# Patient Record
Sex: Male | Born: 1980 | Race: White | Hispanic: No | Marital: Married | Smoking: Former smoker
Health system: Southern US, Community
[De-identification: ages and names within clinical notes are randomized; demographics above are authoritative.]

## PROBLEM LIST (undated history)

## (undated) DIAGNOSIS — I82409 Acute embolism and thrombosis of unspecified deep veins of unspecified lower extremity: Secondary | ICD-10-CM

## (undated) HISTORY — PX: ANTERIOR CRUCIATE LIGAMENT REPAIR: SHX115

---

## 2016-03-07 ENCOUNTER — Emergency Department (HOSPITAL_COMMUNITY)
Admission: EM | Admit: 2016-03-07 | Discharge: 2016-03-08 | Disposition: A | Discharge status: Home / Self Care | Payer: No Typology Code available for payment source | Attending: Emergency Medicine | Admitting: Emergency Medicine

## 2016-03-07 ENCOUNTER — Emergency Department (HOSPITAL_COMMUNITY): Payer: No Typology Code available for payment source

## 2016-03-07 ENCOUNTER — Encounter (HOSPITAL_COMMUNITY): Payer: Self-pay | Admitting: *Deleted

## 2016-03-07 DIAGNOSIS — Y9289 Other specified places as the place of occurrence of the external cause: Secondary | ICD-10-CM | POA: Insufficient documentation

## 2016-03-07 DIAGNOSIS — Y998 Other external cause status: Secondary | ICD-10-CM | POA: Diagnosis not present

## 2016-03-07 DIAGNOSIS — W1849XA Other slipping, tripping and stumbling without falling, initial encounter: Secondary | ICD-10-CM | POA: Insufficient documentation

## 2016-03-07 DIAGNOSIS — S99912A Unspecified injury of left ankle, initial encounter: Secondary | ICD-10-CM | POA: Diagnosis present

## 2016-03-07 DIAGNOSIS — Y9389 Activity, other specified: Secondary | ICD-10-CM | POA: Diagnosis not present

## 2016-03-07 DIAGNOSIS — S86002A Unspecified injury of left Achilles tendon, initial encounter: Secondary | ICD-10-CM | POA: Diagnosis not present

## 2016-03-07 MED ORDER — OXYCODONE-ACETAMINOPHEN 5-325 MG PO TABS
1.0000 | ORAL_TABLET | Freq: Once | ORAL | Status: AC
  Administered 2016-03-07: 1 via ORAL
  Filled 2016-03-07: qty 1

## 2016-03-07 NOTE — ED Notes (Signed)
Pt was playing kickball when he slipped and heard a pop.  Pt worries that he may have injured his achilles tendon, some bruising noted on foot under ankle

## 2016-03-07 NOTE — ED Provider Notes (Signed)
CSN: [id number]     Arrival date & time 03/07/16  2234 History  By signing my name below, I, Bethel Born, attest that this documentation has been prepared under the direction and in the presence of [employer]. Electronically Signed: Bethel Born, ED Scribe. 03/07/2016 11:00 PM  Chief Complaint  Patient presents with  . Ankle Pain    The history is provided by the patient. No language interpreter was used.    Frank Dixon is a 35 y.o. male who presents to the Emergency Department complaining of new, intermittent, 3/10 in severity at rest, posterior left ankle pain with sudden onset this evening while playing kickball. The pt states that he slipped in the wet grass and felt a "pop" at his ankle.  The pain is exacerbated by movement and palpation. He took Advil PTA without sufficient pain relief. He has not attempted to bear weight on the ankle since the fall. Pt denies numbness, tingling, and weakness.   History reviewed. No pertinent past medical history. History reviewed. No pertinent past surgical history. No family history on file. Social History  Substance Use Topics  . Smoking status: Never Smoker   . Smokeless tobacco: None  . Alcohol Use: No     Review of Systems  Musculoskeletal: Positive for myalgias and arthralgias.  Skin: Negative for wound.    Allergies  Review of patient's allergies indicates no known allergies.  Home Medications   Prior to Admission medications   Medication Sig Start Date End Date Taking? Authorizing Provider  oxyCODONE-acetaminophen (PERCOCET/ROXICET) 5-325 MG tablet Take 1 tablet by mouth every 4 (four) hours as needed for severe pain. 03/08/16   Kiyoshi Schaab C Karmela Bram, PA-C    BP 134/81 mmHg  Pulse 67  Temp(Src) 98.6 F (37 C) (Oral)  Resp 20  SpO2 99% Physical Exam  Constitutional: He is oriented to person, place, and time. He appears well-developed and well-nourished. No distress.  HENT:  Head: Normocephalic and  atraumatic.  Right Ear: External ear normal.  Left Ear: External ear normal.  Nose: Nose normal.  Eyes: Conjunctivae and EOM are normal. Right eye exhibits no discharge. Left eye exhibits no discharge. No scleral icterus.  Neck: Normal range of motion. Neck supple.  Cardiovascular: Normal rate and regular rhythm.   Pulmonary/Chest: Effort normal and breath sounds normal. No respiratory distress.  Musculoskeletal: He exhibits tenderness. He exhibits no edema.       Legs: TTP to distal aspect of left posterior calf. Negative thompson test. No erythema or heat. No TTP to left ankle or left foot. Decreased ROM of left ankle due to pain, though patient able to flex and extend his left ankle.  Neurological: He is alert and oriented to person, place, and time.  Skin: Skin is warm and dry. He is not diaphoretic.  Psychiatric: He has a normal mood and affect. His behavior is normal.  Nursing note and vitals reviewed.   ED Course  Procedures (including critical care time)  DIAGNOSTIC STUDIES: Oxygen Saturation is 99% on RA,  normal by my interpretation.    COORDINATION OF CARE: 10:54 PM Discussed treatment plan which includes left ankle XR and pain management with pt at bedside and pt agreed to plan.  Labs Review Labs Reviewed - No data to display  Imaging Review Dg Ankle Complete Left  03/08/2016  CLINICAL DATA:  35 year old male with pain in the left Achilles EXAM: LEFT ANKLE COMPLETE - 3+ VIEW COMPARISON:  None. FINDINGS: There is no fracture or  dislocation. The bones are well mineralized. There is inflammatory changes of the soft tissues of the posterior ankle with the epicenter of the inflammation approximately 6 cm above the superior cortex of the calcaneus concerning for partial injury of the Achilles tendon. Clinical correlation is recommended. Ultrasound or MRI may provide better evaluation of the Achilles tendon. IMPRESSION: No acute osseous pathology. Inflammatory changes of the  superficial soft tissues of the posterior ankle and upper aspect of the Kager's fat pad concerning for Achilles tendon injury. Clinical correlation is recommended. Electronically Signed   By: Elgie CollardArash  Radparvar M.D.   On: 03/08/2016 00:05   I have personally reviewed and evaluated these images as part of my medical decision-making.   EKG Interpretation None      MDM   Final diagnoses:  Injury of left Achilles tendon, initial encounter    35 year old male presents with pain near his left achilles tendon, which he states started today prior to arrival while playing kickball. He notes he heard a popping sound and has had pain since that time. He denies numbness, weakness, paresthesia. Patient is afebrile. Vital signs stable. On exam, patient has TTP to the distal aspect of his left posterior calf. Negative thompson test. No erythema or heat. No TTP to left ankle or left foot. Patient has decreased ROM of his left ankle due to pain, though he is able to flex and extend his left ankle.   Will obtain imaging of left ankle to evaluate for possible avulsion injury. Patient given pain medicine.   Imaging negative for osseous pathology, remarkable for inflammatory changes to the superficial soft tissues and posterior ankle and upper aspect of the kager's fat pad concerning for partial injury of the achilles tendon. Discussed findings with patient. Will place in posterior splint and give crutches. Patient instructed to be non weight-bearing with his left lower extremity. Advised to rest, ice, and elevate. Will give short course of pain medicine. Patient to follow up with orthopedics for further evaluation and management. Strict return precautions discussed. Patient verbalizes his understanding and is in agreement with plan.  BP 129/76 mmHg  Pulse 67  Temp(Src) 98.6 F (37 C) (Oral)  Resp 18  SpO2 97%    Mady Gemmalizabeth C Dillon Livermore, PA-C 03/08/16 [id number]  Pricilla LovelessScott Goldston, MD 03/13/16 [phone]

## 2016-03-08 MED ORDER — OXYCODONE-ACETAMINOPHEN 5-325 MG PO TABS: 1.0000 | ORAL_TABLET | ORAL | Status: AC | PRN

## 2016-03-08 NOTE — Discharge Instructions (Signed)
1. Medications: pain medicine, usual home medications 2. Treatment: rest, drink plenty of fluids, ice, elevate 3. Follow Up: please followup with orthopedics (call tomorrow to make appt) for discussion of your diagnoses and further evaluation after today's visit; if you do not have a primary care doctor use the phone number listed in your discharge paperwork to find one; please return to the ER for increased pain, swelling, numbness, weakness, new or worsening symptoms   Partial (Incomplete) Achilles Tendon Rupture An Achilles tendon rupture is an injury in which the tough, cord-like band that attaches the lower muscles of your leg to your heel (Achilles tendon) tears (ruptures). In a partial Achilles tendon rupture, surgery may not be needed. CAUSES  A tendon may rupture if it is weakened or weakening (degenerative) and a sudden stress is applied to it. Weakening or degeneration of a tendon may be caused by:  Recurrent injuries, such as those causing Achilles tendonitis.  Damaged tendons.  Aging.  Vascular disease of the tendon. SIGNS AND SYMPTOMS  Feeling as if you were struck violently in the back of the ankle.  Hearing a "pop" and experiencing severe, sudden (acute) pain; however, the absence of pain does not mean there was not a rupture. DIAGNOSIS A physical exam is usually all that is needed to diagnose an Achilles tendon rupture. During the exam, your health care provider will touch the tendon and the structures around it. You may be asked to lie on your stomach or kneel on a chair while your health care provider squeezes your calf muscle. You most likely have a ruptured tendon if your foot does not flex.  Sometimes tests are performed. These may include:  Ultrasonography. This allows quick confirmation of the diagnosis.  An X-ray exam.  An MRI. TREATMENT  Treatment consists of:  Ice applied to the area.  Pain relieving medicines.  Rest.  Crutches.  Keeping the ankle  from moving (immobilization), usually with asplint, for 6-10 weeks. If the injury does not improve within 3-6 months, you may need to go to a specialized runners' clinic or see a sports medicine specialist, physical therapist, or orthopedic surgeon. HOME CARE INSTRUCTIONS   Apply ice to the injured area:   Put ice in a plastic bag.  Place a towel between your skin and the bag.  Leave the ice on for 20 minutes, 2-3 times a day.  Use crutches and move about only as instructed by your health care provider.  Keep the leg elevated above the level of the heart (the center of the chest) at all times when not using the bathroom. Do not dangle the leg over a chair, couch, or bed. When lying down, elevate your leg on a few pillows. Elevation prevents swelling and reduces pain.  Avoid use other than gentle range of motion of the toes while the tendon is painful.  Do not drive a car until your health care provider specifically tells you it is safe to do so.  Take all medicines as directed by your health care provider.  Keep all follow-up visits with your health care provider. SEEK MEDICAL CARE IF:   Your pain and swelling increase or pain is uncontrolled with medicines.   You develop new, unexplained symptoms or your symptoms get worse.   You cannot move your toes or foot.  You develop warmth and swelling in your foot.  You have an unexplained fever.  MAKE SURE YOU:   Understand these instructions.  Will watch your condition.  Will  get help right away if you are not doing well or get worse.   This information is not intended to replace advice given to you by your health care provider. Make sure you discuss any questions you have with your health care provider.   Document Released: 09/05/2005 Document Revised: 04/12/2015 Document Reviewed: 07/17/2013 Elsevier Interactive Patient Education [employer]2016 Elsevier Inc.

## 2016-03-08 NOTE — Progress Notes (Signed)
Orthopedic Tech Progress Note Patient Details:  Joycelyn ManRyan T Vanriper Mar 10, 1981 [id number]  Ortho Devices Type of Ortho Device: Crutches, Post (short leg) splint Ortho Device/Splint Location: lle Ortho Device/Splint Interventions: Ordered, Application   Trinna PostMartinez, Byrd Terrero J 03/08/2016, 12:43 AM

## 2016-06-07 ENCOUNTER — Other Ambulatory Visit (HOSPITAL_COMMUNITY): Payer: Self-pay | Admitting: Orthopedic Surgery

## 2016-06-07 DIAGNOSIS — M79605 Pain in left leg: Secondary | ICD-10-CM

## 2016-06-07 DIAGNOSIS — M7989 Other specified soft tissue disorders: Principal | ICD-10-CM

## 2016-06-08 ENCOUNTER — Ambulatory Visit (HOSPITAL_COMMUNITY)
Admission: RE | Admit: 2016-06-08 | Discharge: 2016-06-08 | Disposition: A | Discharge status: Home / Self Care | Payer: No Typology Code available for payment source | Source: Ambulatory Visit | Attending: Orthopedic Surgery | Admitting: Orthopedic Surgery

## 2016-06-08 DIAGNOSIS — M79602 Pain in left arm: Secondary | ICD-10-CM

## 2016-06-08 DIAGNOSIS — M7989 Other specified soft tissue disorders: Secondary | ICD-10-CM | POA: Diagnosis not present

## 2016-06-08 DIAGNOSIS — M79605 Pain in left leg: Secondary | ICD-10-CM | POA: Diagnosis not present

## 2016-06-08 NOTE — Progress Notes (Signed)
Preliminary results by tech - Left Lower Ext. Venous Duplex Completed. Positive for acute deep vein thrombosis involving the peroneal veins. All other veins are patent without evidence of thrombus. Results given to Darcey at  Dr. Candise Bowensaffrey's office.   Marilynne Halstedita Tiffane Sheldon, BS, RDMS, RVT

## 2016-06-18 ENCOUNTER — Telehealth: Payer: Self-pay | Admitting: Cardiovascular Disease

## 2016-06-18 NOTE — Telephone Encounter (Signed)
[facility]ointment on 07/10/16 with Dr Allyson SabalBerry.  [facility] Dr Hazle CocaBerry's schedule on 07/10/16. lp

## 2016-07-10 ENCOUNTER — Ambulatory Visit (INDEPENDENT_AMBULATORY_CARE_PROVIDER_SITE_OTHER): Payer: No Typology Code available for payment source | Admitting: Cardiovascular Disease

## 2016-07-10 ENCOUNTER — Encounter: Payer: Self-pay | Admitting: Cardiovascular Disease

## 2016-07-10 VITALS — BP 126/68 | HR 60 | Ht 73.0 in | Wt 252.0 lb

## 2016-07-10 DIAGNOSIS — I82409 Acute embolism and thrombosis of unspecified deep veins of unspecified lower extremity: Secondary | ICD-10-CM | POA: Insufficient documentation

## 2016-07-10 DIAGNOSIS — I82402 Acute embolism and thrombosis of unspecified deep veins of left lower extremity: Secondary | ICD-10-CM

## 2016-07-10 DIAGNOSIS — Z0000 Encounter for general adult medical examination without abnormal findings: Secondary | ICD-10-CM

## 2016-07-10 MED ORDER — XARELTO 20 MG PO TABS: 20.0000 mg | ORAL_TABLET | Freq: Every day | ORAL | 0 refills | Status: DC

## 2016-07-10 NOTE — Progress Notes (Signed)
07/10/2016 Frank Dixon   [birth year]  [id number]  Primary Physician No PCP Per Patient Primary Cardiologist: Runell Gess MD Roseanne Reno  HPI:  Frank Dixon is a very pleasant 35 year old mildly overweight married Caucasian male with no children is accompanied by his wife today. He is referred by [facility] orthopedic surgeons for vascular evaluation because of a recently diagnosed left peroneal vein DVT. He has no medical problems. He was playing kickball in March and tore his Achilles tendon. He had surgery performed by Dr. Madelon Lips in early April. He was undergoing physical therapy and was seen by a PA in late June noticed some residual swelling. 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 He was started on Xarelto oral anticoagulation at that time. Is feeling somewhat improved.   Current Outpatient Prescriptions  Medication Sig Dispense Refill  . oxyCODONE-acetaminophen (PERCOCET/ROXICET) 5-325 MG tablet Take 1 tablet by mouth every 4 (four) hours as needed for severe pain. 8 tablet 0  . XARELTO 20 MG TABS tablet Take 20 mg by mouth daily.     No current facility-administered medications for this visit.     No Known Allergies  Social History   Social History  . Marital status: Married    Spouse name: N/A  . Number of children: N/A  . Years of education: N/A   Occupational History  . Not on file.   Social History Main Topics  . Smoking status: Former Games developer  . Smokeless tobacco: Never Used  . Alcohol use No  . Drug use: No  . Sexual activity: Not on file   Other Topics Concern  . Not on file   Social History Narrative  . No narrative on file     Review of Systems: General: negative for chills, fever, night sweats or weight changes.  Cardiovascular: negative for chest pain, dyspnea on exertion, edema, orthopnea, palpitations, paroxysmal nocturnal dyspnea or shortness of breath Dermatological: negative for  rash Respiratory: negative for cough or wheezing Urologic: negative for hematuria Abdominal: negative for nausea, vomiting, diarrhea, bright red blood per rectum, melena, or hematemesis Neurologic: negative for visual changes, syncope, or dizziness All other systems reviewed and are otherwise negative except as noted above.    Blood pressure 126/68, pulse 60, height  (1.854 m), weight 252 lb (114.3 kg).  General appearance: alert and no distress Neck: no adenopathy, no carotid bruit, no JVD, supple, symmetrical, trachea midline and thyroid not enlarged, symmetric, no tenderness/mass/nodules Lungs: clear to auscultation bilaterally Heart: regular rate and rhythm, S1, S2 normal, no murmur, click, rub or gallop Extremities: extremities normal, atraumatic, no cyanosis or edema  EKG normal sinus rhythm at 60 without ST or T-wave changes. I personally reviewed this EKG  ASSESSMENT AND PLAN:   DVT of lower limb, acute (HCC) Frank Dixon is being seen today as a referral from [facility] therapeutic surgeons for evaluation treatment of DVT. He apparently was playing kickball in March and tore his Achilles tendon. He had surgery done by Dr. Madelon Lips in early April. When evaluated at the end of June he was noted to have some swelling of his left leg and a Doppler study performed 06/08/16 showed left peroneal vein DVT. At that point he was started on Xarelto which resulted in improvement in the swelling. I'm going to continue his oral regulation for 3 months recheck venous Doppler studies. That time we can stop his oral and hydration and he will be  seen back.      Runell Gess MD FACP,FACC,FAHA, [facility] 07/10/2016 10:38 AM

## 2016-07-10 NOTE — Patient Instructions (Signed)
Medication Instructions:  Your physician recommends that you continue on your current medications as directed. Please refer to the Current Medication list given to you today.   Testing/Procedures: Your physician has requested that you have a LEFT lower extremity venous duplex. This test is an ultrasound of the veins in the legs. It looks at venous blood flow that carries blood from the heart to the legs. Allow one hour for a Lower Venous exam. There are no restrictions or special instructions.  IN September 09, 2016.   Follow-Up: Your physician recommends that you schedule a follow-up appointment ON AN AS NEEDED BASIS.   If you need a refill on your cardiac medications before your next appointment, please call your pharmacy.

## 2016-07-10 NOTE — Assessment & Plan Note (Signed)
Frank Dixon is being seen today as a referral from [facility] for evaluation treatment of DVT. He apparently was playing kickball in March and tore his Achilles tendon. He had surgery done by Dr. Madelon Lips in early April. When evaluated at the end of June he was noted to have some swelling of his left leg and a Doppler study performed 06/08/16 showed left peroneal vein DVT. At that point he was started on Xarelto which resulted in improvement in the swelling. I'm going to continue his oral regulation for 3 months recheck venous Doppler studies. That time we can stop his oral and hydration and he will be seen back.

## 2016-07-10 NOTE — Addendum Note (Signed)
Addended by: Stann Mainland on: 07/10/2016 10:49 AM   Modules accepted: Orders

## 2016-09-10 ENCOUNTER — Emergency Department (HOSPITAL_COMMUNITY)
Admission: EM | Admit: 2016-09-10 | Discharge: 2016-09-10 | Disposition: A | Discharge status: Home / Self Care | Payer: No Typology Code available for payment source | Attending: Emergency Medicine | Admitting: Emergency Medicine

## 2016-09-10 ENCOUNTER — Ambulatory Visit (HOSPITAL_COMMUNITY)
Admission: EM | Admit: 2016-09-10 | Discharge: 2016-09-10 | Disposition: A | Discharge status: Home / Self Care | Payer: No Typology Code available for payment source

## 2016-09-10 ENCOUNTER — Encounter (HOSPITAL_COMMUNITY): Payer: Self-pay

## 2016-09-10 DIAGNOSIS — Y9301 Activity, walking, marching and hiking: Secondary | ICD-10-CM | POA: Insufficient documentation

## 2016-09-10 DIAGNOSIS — S71152A Open bite, left thigh, initial encounter: Secondary | ICD-10-CM | POA: Diagnosis not present

## 2016-09-10 DIAGNOSIS — Y999 Unspecified external cause status: Secondary | ICD-10-CM | POA: Insufficient documentation

## 2016-09-10 DIAGNOSIS — Y929 Unspecified place or not applicable: Secondary | ICD-10-CM | POA: Insufficient documentation

## 2016-09-10 DIAGNOSIS — Z23 Encounter for immunization: Secondary | ICD-10-CM

## 2016-09-10 DIAGNOSIS — W540XXA Bitten by dog, initial encounter: Secondary | ICD-10-CM | POA: Diagnosis not present

## 2016-09-10 DIAGNOSIS — Z87891 Personal history of nicotine dependence: Secondary | ICD-10-CM | POA: Diagnosis not present

## 2016-09-10 HISTORY — DX: Acute embolism and thrombosis of unspecified deep veins of unspecified lower extremity: I82.409

## 2016-09-10 MED ORDER — TETANUS-DIPHTH-ACELL PERTUSSIS 5-2.5-18.5 LF-MCG/0.5 IM SUSP
0.5000 mL | Freq: Once | INTRAMUSCULAR | Status: AC
  Administered 2016-09-10: 0.5 mL via INTRAMUSCULAR
  Filled 2016-09-10: qty 0.5

## 2016-09-10 MED ORDER — RABIES VACCINE, PCEC IM SUSR: 1.0000 mL | Freq: Once | INTRAMUSCULAR | Status: DC

## 2016-09-10 MED ORDER — RABIES VACCINE, PCEC IM SUSR
1.0000 mL | Freq: Once | INTRAMUSCULAR | Status: AC
  Administered 2016-09-10: 1 mL via INTRAMUSCULAR
  Filled 2016-09-10: qty 1

## 2016-09-10 NOTE — ED Notes (Signed)
Declined W/C at D/C and was escorted to lobby by RN. 

## 2016-09-10 NOTE — ED Provider Notes (Signed)
[facility]-EMERGENCY DEPT Provider Note   CSN: [id number] Arrival date & time: 09/10/16  1025  By signing my name below, I, Majel HomerPeyton Lee, attest that this documentation has been prepared under the direction and in the presence of non-physician practitioner, Arthor CaptainAbigail Alantis Bethune, PA-C. Electronically Signed: Majel HomerPeyton Lee, Scribe. 09/10/2016. 11:48 AM.  History   Chief Complaint Chief Complaint  Patient presents with  . Animal Bite   The history is provided by the patient. No language interpreter was used.   HPI Comments: Frank Dixon is a 35 y.o. male who presents to the Emergency Department for an evaluation s/p a dog bite to his left posterior thigh that occurred 3 days ago. Pt reports he was walking in his neighborhood 3 days ago when he passed a dog that was trailing behind his owner. He states the dog jumped up and bit his left posterior thigh but he did not even notice the bites until much later. He notes the bite broke the skin with a small amount of blood but states it was not deep. He reports he has been unable to find the dog or owner since the incident and is unsure if the dog is up to date with his vaccinations. He denies fever, redness, drainage any signs of infection from the site. 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   Past Medical History:  Diagnosis Date  . DVT (deep venous thrombosis) [facility](HCC)     Patient Active Problem List   Diagnosis Date Noted  . DVT of lower limb, acute (HCC) 07/10/2016    Past Surgical History:  Procedure Laterality Date  . ANTERIOR CRUCIATE LIGAMENT REPAIR      Home Medications    Prior to Admission medications   Medication Sig Start Date End Date Taking? Authorizing Provider  oxyCODONE-acetaminophen (PERCOCET/ROXICET) 5-325 MG tablet Take 1 tablet by mouth every 4 (four) hours as needed for severe pain. 03/08/16   Elizabeth C Westfall, PA-C  XARELTO 20 MG TABS tablet Take 1 tablet (20 mg total) by  mouth daily. 07/10/16   Runell GessJonathan J Berry, MD    Family History No family history on file.  Social History Social History  Substance Use Topics  . Smoking status: Former Games developermoker  . Smokeless tobacco: Never Used  . Alcohol use No   Allergies   Review of patient's allergies indicates no known allergies.   Review of Systems Review of Systems  Constitutional: Negative for fever.  Skin: Positive for wound. Negative for color change.   Physical Exam Updated Vital Signs BP 114/70 (BP Location: Right Arm)   Pulse 61   Temp 98.2 F (36.8 C) (Oral)   Resp 16   SpO2 96%   Physical Exam  Constitutional: He is oriented to person, place, and time. He appears well-developed and well-nourished.  HENT:  Head: Normocephalic.  Eyes: EOM are normal.  Neck: Normal range of motion.  Pulmonary/Chest: Effort normal.  Abdominal: He exhibits no distension.  Musculoskeletal: Normal range of motion.  Neurological: He is alert and oriented to person, place, and time.  Skin:  2 small puncture wounds to posterior left upper thigh.   Psychiatric: He has a normal mood and affect.  Nursing note and vitals reviewed.  ED Treatments / Results  Labs (all labs ordered are listed, but only abnormal results are displayed) Labs Reviewed - No data to display  EKG  EKG Interpretation None       Radiology No results  found.  Procedures Procedures (including critical care time)  Medications Ordered in ED Medications - No data to display  DIAGNOSTIC STUDIES:  Oxygen Saturation is 96% on RA, normal by my interpretation.    COORDINATION OF CARE:  11:45 AM Discussed treatment plan with pt at bedside and pt agreed to plan.  Initial Impression / Assessment and Plan / ED Course  I have reviewed the triage vital signs and the nursing notes.  Pertinent labs & imaging results that were available during my care of the patient were reviewed by me and considered in my medical decision making (see chart  for details).  Clinical Course     Patient with need for rabies vaccination. He has had pre-exposure vaccination. I consulted with Infectious disease pharmacist on call who reccomends 1 vaccine today and 1 vaccine on day 3, per [place] Rabies control board. Patient will follow up in 10/5 for 2nd and last vaccine. Appears safe for d.c/  I personally performed the services described in this documentation, which was scribed in my presence. The recorded information has been reviewed and is accurate.   Final Clinical Impressions(s) / ED Diagnoses   Final diagnoses:  None    New Prescriptions New Prescriptions   No medications on file     Arthor Captain, PA-C 09/10/16 1309    Donnetta Hutching, MD 09/11/16 1235

## 2016-09-10 NOTE — ED Triage Notes (Signed)
Per Pt, Pt is coming from home with complaints of dog bite on Friday from a neighborhood dog that was being walked. Pt was unable to find the owner or dog after the event and is unaware of the status of the dog's vaccines. Pt has had pre-exposure rabies shots, but wants to be treated. Pt reports the bite breaking the skin. Denies any redness, fever, or discharge from the bite.

## 2016-09-13 ENCOUNTER — Encounter (HOSPITAL_COMMUNITY): Payer: Self-pay | Admitting: Emergency Medicine

## 2016-09-13 ENCOUNTER — Ambulatory Visit (HOSPITAL_COMMUNITY)
Admission: EM | Admit: 2016-09-13 | Discharge: 2016-09-13 | Disposition: A | Discharge status: Home / Self Care | Payer: No Typology Code available for payment source | Attending: Emergency Medicine | Admitting: Emergency Medicine

## 2016-09-13 DIAGNOSIS — Z203 Contact with and (suspected) exposure to rabies: Secondary | ICD-10-CM | POA: Diagnosis not present

## 2016-09-13 MED ORDER — RABIES VACCINE, PCEC IM SUSR
1.0000 mL | Freq: Once | INTRAMUSCULAR | Status: AC
  Administered 2016-09-13: 1 mL via INTRAMUSCULAR

## 2016-09-13 MED ORDER — RABIES VACCINE, PCEC IM SUSR
INTRAMUSCULAR | Status: AC
  Filled 2016-09-13: qty 1

## 2016-09-13 NOTE — ED Triage Notes (Signed)
Pt here for his only dose in his series of rabies vaccines.  Pt has had preexposure injections in the past.  Pt denies any issues with the bite.

## 2016-09-14 ENCOUNTER — Ambulatory Visit (HOSPITAL_COMMUNITY)
Admission: RE | Admit: 2016-09-14 | Discharge: 2016-09-14 | Disposition: A | Discharge status: Home / Self Care | Payer: No Typology Code available for payment source | Source: Ambulatory Visit | Attending: Cardiology | Admitting: Cardiology

## 2016-09-14 DIAGNOSIS — M7989 Other specified soft tissue disorders: Secondary | ICD-10-CM | POA: Diagnosis not present

## 2016-09-14 DIAGNOSIS — I82402 Acute embolism and thrombosis of unspecified deep veins of left lower extremity: Secondary | ICD-10-CM

## 2016-09-18 ENCOUNTER — Telehealth: Payer: Self-pay | Admitting: *Deleted

## 2016-09-18 NOTE — Telephone Encounter (Signed)
Results called to pt. Pt verbalized understanding to stop Xarelto.  He asked if he could get a massage, I encouraged him to call his orthopaedic doctor to get a note and if needed call us back. Xarelto discontinued.

## 2016-09-18 NOTE — Telephone Encounter (Signed)
-----   Message from Runell GessJonathan J Berry, MD sent at 09/15/2016  4:54 PM EDT ----- Essentially nl. No evid of DVT seen on prior exam. Can D/C Xarelto

## 2017-04-18 IMAGING — DX DG ANKLE COMPLETE 3+V*L*
3 series · 3 of 3 positions shown · non-contrast
Comparison: None.

CLINICAL DATA: 34-year-old male with pain in the left Achilles

EXAM:
LEFT ANKLE COMPLETE - 3+ VIEW

[ankle ap]
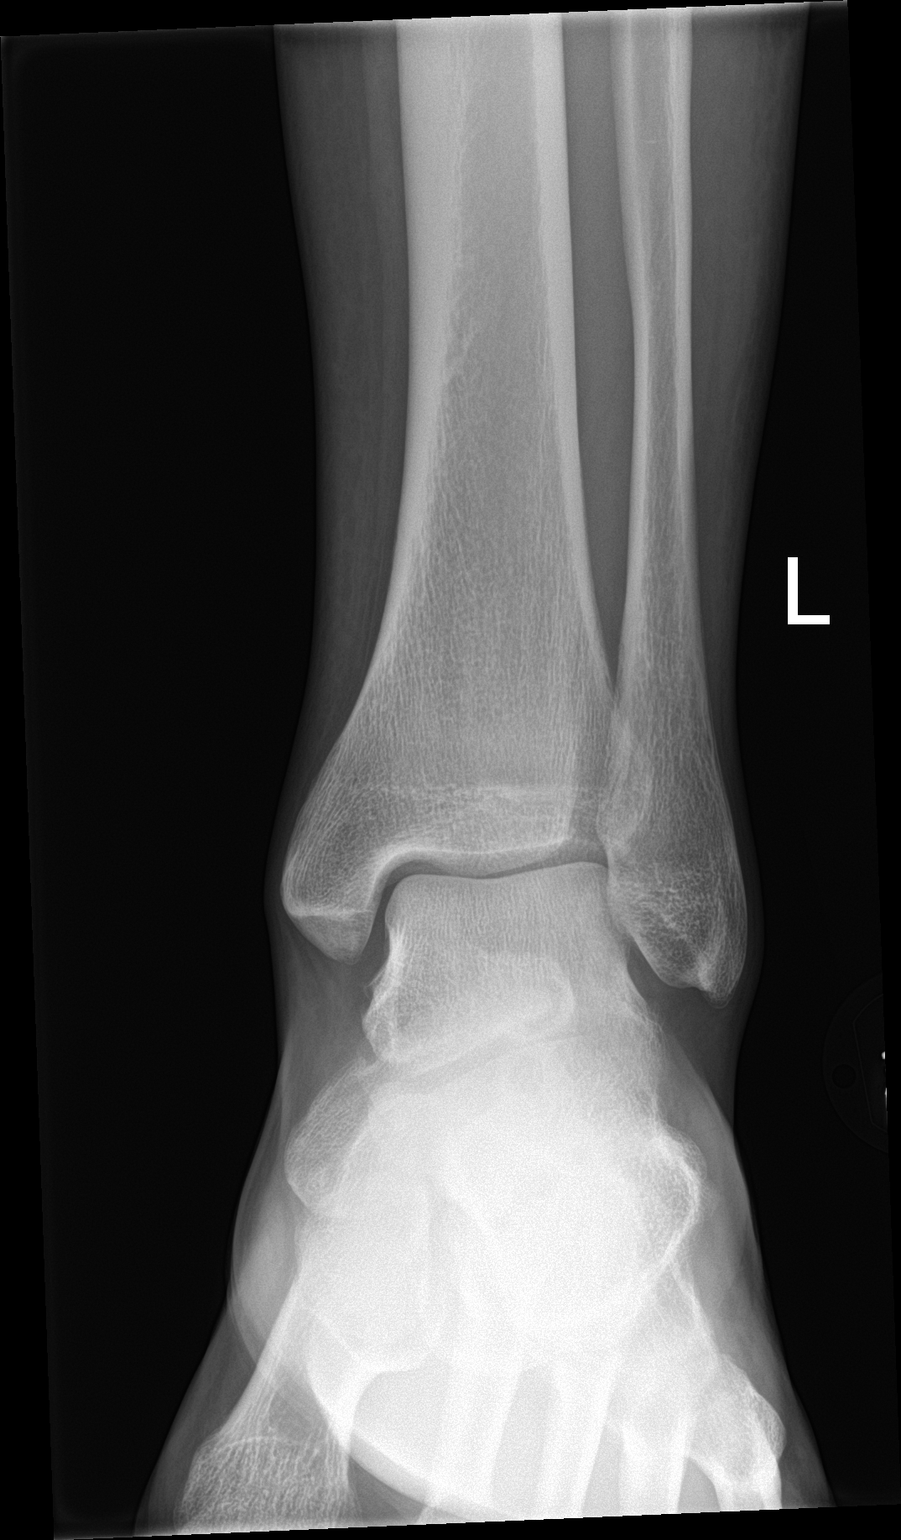

[ankle obl]
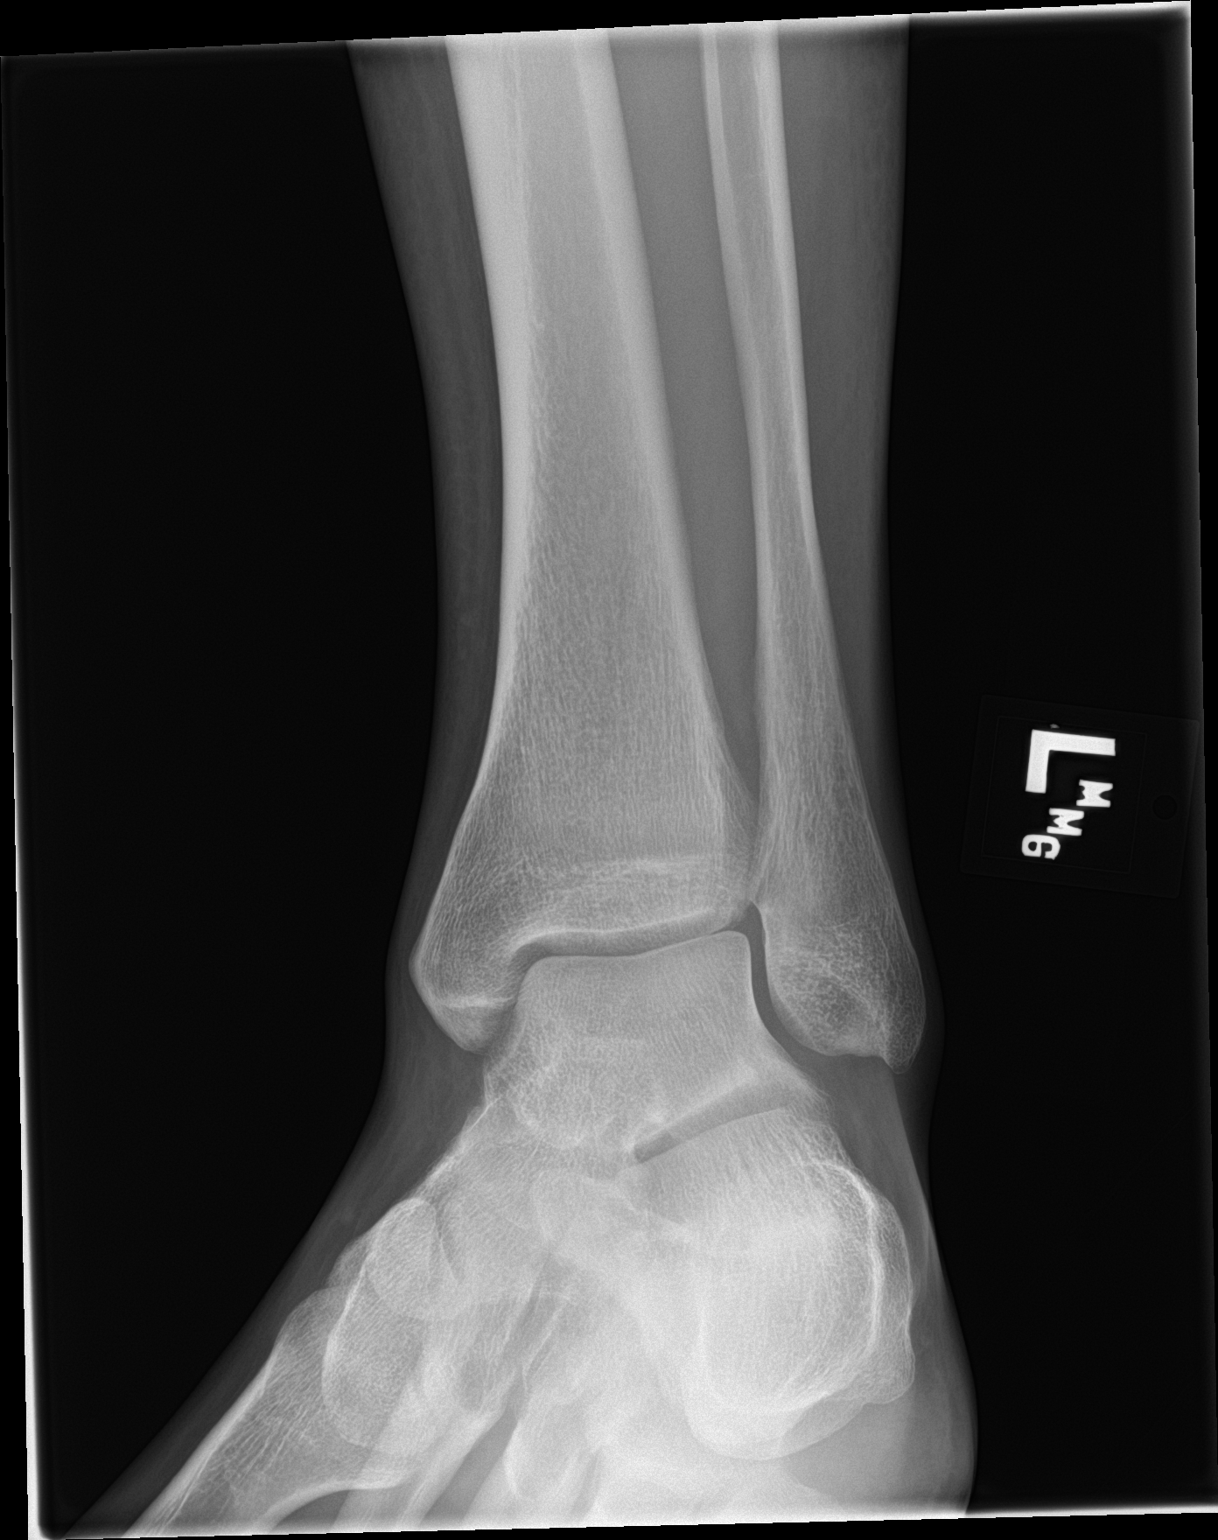

[ankle lat]
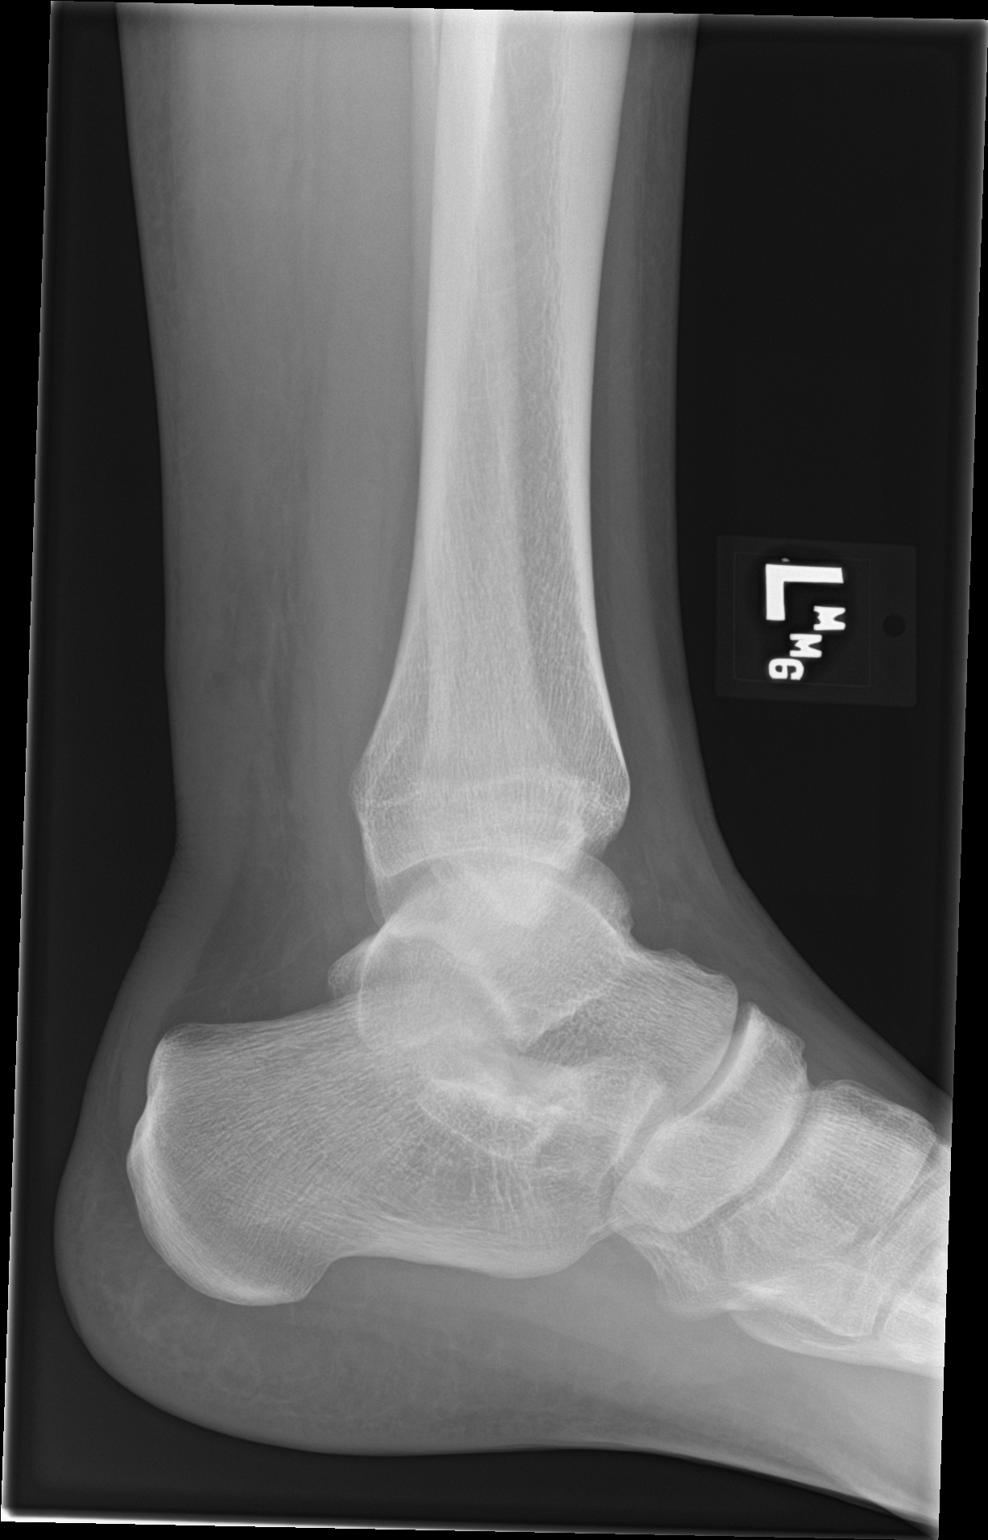

[3 of 3 positions shown; findings below may reference images not displayed]

FINDINGS: There is no fracture or dislocation. The bones are well mineralized.
There is inflammatory changes of the soft tissues of the posterior
ankle with the epicenter of the inflammation approximately 6 cm
above the superior cortex of the calcaneus concerning for partial
injury of the Achilles tendon. Clinical correlation is recommended.
Ultrasound or MRI may provide better evaluation of the Achilles
tendon.
IMPRESSION: No acute osseous pathology.

Inflammatory changes of the superficial soft tissues of the
posterior ankle and upper aspect of the Kager's fat pad concerning
for Achilles tendon injury. Clinical correlation is recommended.
